# Patient Record
Sex: Male | Born: 2005 | Hispanic: Yes | Marital: Single | Smoking: Never smoker
Health system: Southern US, Community
[De-identification: ages and names within clinical notes are randomized; demographics above are authoritative.]

---
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
## 2023-01-08 DIAGNOSIS — R634 Abnormal weight loss: Secondary | ICD-10-CM | POA: Diagnosis not present

## 2023-04-09 DIAGNOSIS — Z0000 Encounter for general adult medical examination without abnormal findings: Secondary | ICD-10-CM | POA: Diagnosis not present

## 2023-04-09 DIAGNOSIS — Z7251 High risk heterosexual behavior: Secondary | ICD-10-CM | POA: Diagnosis not present

## 2024-01-07 DIAGNOSIS — H5213 Myopia, bilateral: Secondary | ICD-10-CM | POA: Diagnosis not present
